# Patient Record
Sex: Female | Born: 2005 | Race: Black or African American | Hispanic: No | Marital: Single
Health system: Southern US, Community
[De-identification: ages and names within clinical notes are randomized; demographics above are authoritative.]

---
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
## 2023-03-06 ENCOUNTER — Emergency Department (HOSPITAL_COMMUNITY)
Admission: EM | Admit: 2023-03-06 | Discharge: 2023-03-06 | Disposition: A | Discharge status: Home / Self Care | Payer: Medicaid Other | Attending: Emergency Medicine | Admitting: Emergency Medicine

## 2023-03-06 ENCOUNTER — Emergency Department (HOSPITAL_COMMUNITY): Payer: Medicaid Other

## 2023-03-06 ENCOUNTER — Other Ambulatory Visit: Payer: Self-pay

## 2023-03-06 DIAGNOSIS — R55 Syncope and collapse: Secondary | ICD-10-CM | POA: Diagnosis present

## 2023-03-06 DIAGNOSIS — R519 Headache, unspecified: Secondary | ICD-10-CM | POA: Diagnosis not present

## 2023-03-06 DIAGNOSIS — R4020 Unspecified coma: Secondary | ICD-10-CM

## 2023-03-06 LAB — CBC WITH DIFFERENTIAL/PLATELET
Abs Immature Granulocytes: 0.02 10*3/uL (ref 0.00–0.07)
Basophils Absolute: 0.1 10*3/uL (ref 0.0–0.1)
Basophils Relative: 1 %
Eosinophils Absolute: 0.1 10*3/uL (ref 0.0–0.5)
Eosinophils Relative: 2 %
HCT: 35 % — ABNORMAL LOW (ref 36.0–46.0)
Hemoglobin: 12.1 g/dL (ref 12.0–15.0)
Immature Granulocytes: 0 %
Lymphocytes Relative: 27 %
Lymphs Abs: 2.5 10*3/uL (ref 0.7–4.0)
MCH: 30.9 pg (ref 26.0–34.0)
MCHC: 34.6 g/dL (ref 30.0–36.0)
MCV: 89.3 fL (ref 80.0–100.0)
Monocytes Absolute: 0.4 10*3/uL (ref 0.1–1.0)
Monocytes Relative: 5 %
Neutro Abs: 6.1 10*3/uL (ref 1.7–7.7)
Neutrophils Relative %: 65 %
Platelets: 198 10*3/uL (ref 150–400)
RBC: 3.92 MIL/uL (ref 3.87–5.11)
RDW: 12 % (ref 11.5–15.5)
WBC: 9.2 10*3/uL (ref 4.0–10.5)
nRBC: 0 % (ref 0.0–0.2)

## 2023-03-06 LAB — URINALYSIS, ROUTINE W REFLEX MICROSCOPIC
Bilirubin Urine: NEGATIVE
Glucose, UA: NEGATIVE mg/dL
Hgb urine dipstick: NEGATIVE
Ketones, ur: NEGATIVE mg/dL
Leukocytes,Ua: NEGATIVE
Nitrite: NEGATIVE
Protein, ur: NEGATIVE mg/dL
Specific Gravity, Urine: 1.009 (ref 1.005–1.030)
pH: 8 (ref 5.0–8.0)

## 2023-03-06 LAB — RAPID URINE DRUG SCREEN, HOSP PERFORMED
Amphetamines: NOT DETECTED
Barbiturates: NOT DETECTED
Benzodiazepines: NOT DETECTED
Cocaine: NOT DETECTED
Opiates: NOT DETECTED
Tetrahydrocannabinol: POSITIVE — AB

## 2023-03-06 LAB — COMPREHENSIVE METABOLIC PANEL
ALT: 21 U/L (ref 0–44)
AST: 20 U/L (ref 15–41)
Albumin: 3.9 g/dL (ref 3.5–5.0)
Alkaline Phosphatase: 47 U/L (ref 38–126)
Anion gap: 6 (ref 5–15)
BUN: 8 mg/dL (ref 6–20)
CO2: 24 mmol/L (ref 22–32)
Calcium: 8.7 mg/dL — ABNORMAL LOW (ref 8.9–10.3)
Chloride: 108 mmol/L (ref 98–111)
Creatinine, Ser: 0.71 mg/dL (ref 0.44–1.00)
GFR, Estimated: >60 mL/min (ref >60)
Glucose, Bld: 98 mg/dL (ref 70–99)
Potassium: 3.5 mmol/L (ref 3.5–5.1)
Sodium: 138 mmol/L (ref 135–145)
Total Bilirubin: 0.4 mg/dL (ref 0.3–1.2)
Total Protein: 7.2 g/dL (ref 6.5–8.1)

## 2023-03-06 LAB — MAGNESIUM: Magnesium: 1.9 mg/dL (ref 1.7–2.4)

## 2023-03-06 LAB — PREGNANCY, URINE: Preg Test, Ur: NEGATIVE

## 2023-03-06 LAB — AMMONIA: Ammonia: 33 umol/L (ref 9–35)

## 2023-03-06 MED ORDER — KETOROLAC TROMETHAMINE 15 MG/ML IJ SOLN
15.0000 mg | Freq: Once | INTRAMUSCULAR | Status: AC
  Administered 2023-03-06: 15 mg via INTRAVENOUS
  Filled 2023-03-06: qty 1

## 2023-03-06 MED ORDER — DIPHENHYDRAMINE HCL 50 MG/ML IJ SOLN
12.5000 mg | Freq: Once | INTRAMUSCULAR | Status: AC
  Administered 2023-03-06: 12.5 mg via INTRAVENOUS
  Filled 2023-03-06: qty 1

## 2023-03-06 MED ORDER — LACTATED RINGERS IV BOLUS
1000.0000 mL | Freq: Once | INTRAVENOUS | Status: AC
  Administered 2023-03-06: 1000 mL via INTRAVENOUS

## 2023-03-06 MED ORDER — METOCLOPRAMIDE HCL 5 MG/ML IJ SOLN
10.0000 mg | Freq: Once | INTRAMUSCULAR | Status: AC
  Administered 2023-03-06: 10 mg via INTRAVENOUS
  Filled 2023-03-06: qty 2

## 2023-03-06 NOTE — ED Triage Notes (Signed)
Pt BIBA from work. Pt found unresponsive on floor of [employer]. GCS of 6, only withdrawing from pain.   20g LAC  Pt responsive 10 min after arrival to ED. Aox4.

## 2023-03-06 NOTE — ED Provider Notes (Addendum)
[place] EMERGENCY DEPARTMENT AT [facility] Provider Note   CSN: [id number] Arrival date & time: 03/06/23  1854     History  Chief Complaint  Patient presents with   Loss of Consciousness    Sherri Day is a 17 y.o. female.  HPI Patient presents following syncopal episode.  This occurred while at work at [employer].  She was found facedown on the floor.  EMS noted GCS of 6 prior to arrival.  Vital signs were normal.  Medical history includes similar episodes.  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  Per chart review, she was seen at PCPs office 1 month ago.  Documentation from that time noted that over the past 6 months, they have been occurring every 1 to 2 weeks.  She is typically unconscious for several minutes.  She will have occasionally twitching episodes without convulsions.  She has had recent headaches.  She was previously seen by neurology and prescribed an unknown medication.  She has stopped taking this medication.  Sister reports that she has had increasing frequency of these episodes.  She has had 3 episodes this week.  Estimated duration of LOC today was 20 minutes.  Patient currently endorses generalized headache.  Patient reports that she has been having daily headaches over the past 6 months.    Home Medications Prior to Admission medications   Medication Sig Start Date End Date Taking? Authorizing Provider  ibuprofen (ADVIL) 100 MG/5ML suspension Take 20 mLs (400 mg total) by mouth every 6 (six) hours as needed. 08/07/20   Lorin Picket, NP  ibuprofen (ADVIL) 400 MG tablet Take 1 tablet (400 mg total) by mouth every 6 (six) hours as needed. 08/07/20   Lorin Picket, NP      Allergies    Patient has no known allergies.    Review of Systems   Review of Systems  Constitutional:  Positive for fatigue.  Neurological:  Positive for syncope and headaches.  All other systems reviewed and are negative.   Physical Exam Updated Vital  Signs BP 118/81 (BP Location: Left Arm)   Pulse 84   Temp 98.7 F (37.1 C) (Oral)   Resp 14   Ht 5\' 2"  (1.575 m)   Wt 52.2 kg   LMP  (LMP Unknown)   SpO2 100%   BMI 21.03 kg/m  Physical Exam Vitals and nursing note reviewed.  Constitutional:      General: She is not in acute distress.    Appearance: Normal appearance. She is well-developed. She is not ill-appearing, toxic-appearing or diaphoretic.  HENT:     Head: Normocephalic and atraumatic.     Right Ear: External ear normal.     Left Ear: External ear normal.     Nose: Nose normal.     Mouth/Throat:     Mouth: Mucous membranes are moist.  Eyes:     Extraocular Movements: Extraocular movements intact.     Conjunctiva/sclera: Conjunctivae normal.  Cardiovascular:     Rate and Rhythm: Normal rate and regular rhythm.     Heart sounds: No murmur heard. Pulmonary:     Effort: Pulmonary effort is normal. No respiratory distress.     Breath sounds: Normal breath sounds. No wheezing or rales.  Abdominal:     General: There is no distension.     Palpations: Abdomen is soft.     Tenderness: There is no abdominal tenderness.  Musculoskeletal:        General: No swelling or  deformity. Normal range of motion.     Cervical back: Normal range of motion and neck supple.     Right lower leg: No edema.     Left lower leg: No edema.  Skin:    General: Skin is warm and dry.     Coloration: Skin is not jaundiced or pale.  Neurological:     General: No focal deficit present.     Mental Status: She is alert and oriented to person, place, and time.     Cranial Nerves: No cranial nerve deficit.     Sensory: No sensory deficit.     Motor: No weakness.     Coordination: Coordination normal.  Psychiatric:        Mood and Affect: Mood normal. Affect is flat.        Speech: Speech is delayed.        Behavior: Behavior is slowed and withdrawn. Behavior is cooperative.     ED Results / Procedures / Treatments   Labs (all labs ordered  are listed, but only abnormal results are displayed) Labs Reviewed  COMPREHENSIVE METABOLIC PANEL - Abnormal; Notable for the following components:      Result Value   Calcium 8.7 (*)    All other components within normal limits  CBC WITH DIFFERENTIAL/PLATELET - Abnormal; Notable for the following components:   HCT 35.0 (*)    All other components within normal limits  URINALYSIS, ROUTINE W REFLEX MICROSCOPIC - Abnormal; Notable for the following components:   Color, Urine STRAW (*)    All other components within normal limits  RAPID URINE DRUG SCREEN, HOSP PERFORMED - Abnormal; Notable for the following components:   Tetrahydrocannabinol POSITIVE (*)    All other components within normal limits  AMMONIA  PREGNANCY, URINE  MAGNESIUM  CBG MONITORING, ED    EKG None  Radiology CT HEAD WO CONTRAST  Result Date: 03/06/2023 CLINICAL DATA:  Altered mental status. EXAM: CT HEAD WITHOUT CONTRAST TECHNIQUE: Contiguous axial images were obtained from the base of the skull through the vertex without intravenous contrast. RADIATION DOSE REDUCTION: This exam was performed according to the departmental dose-optimization program which includes automated exposure control, adjustment of the mA and/or kV according to patient size and/or use of iterative reconstruction technique. COMPARISON:  None Available. FINDINGS: Brain: No evidence of intracranial hemorrhage, acute infarction, hydrocephalus, extra-axial collection, or mass lesion/mass effect. Vascular:  No hyperdense vessel or other acute findings. Skull: No evidence of fracture or other significant bone abnormality. Sinuses/Orbits:  No acute findings. Other: None. IMPRESSION: Negative noncontrast head CT. Electronically Signed   By: Danae Orleans M.D.   On: 03/06/2023 20:35    Procedures Procedures    Medications Ordered in ED Medications  metoCLOPramide (REGLAN) injection 10 mg (10 mg Intravenous Given 03/06/23 2011)  diphenhydrAMINE (BENADRYL)  injection 12.5 mg (12.5 mg Intravenous Given 03/06/23 2011)  lactated ringers bolus 1,000 mL (1,000 mLs Intravenous New Bag/Given 03/06/23 2016)  ketorolac (TORADOL) 15 MG/ML injection 15 mg (15 mg Intravenous Given 03/06/23 2011)    ED Course/ Medical Decision Making/ A&P                             Medical Decision Making Amount and/or Complexity of Data Reviewed Labs: ordered. Radiology: ordered.  Risk Prescription drug management.   This patient presents to the ED for concern of LOC, this involves an extensive number of treatment options, and is a complaint that  carries with it a high risk of complications and morbidity.  The differential diagnosis includes syncope, seizure, hypoglycemia, other metabolic derangement, complex migraine, psychogenic etiology   Co morbidities that complicate the patient evaluation  N/A   Additional history obtained:  Additional history obtained from EMS, patient's sister External records from outside source obtained and reviewed including EMR   Lab Tests:  I Ordered, and personally interpreted labs.  The pertinent results include: Normal kidney function, normal electrolytes, normal hemoglobin, no leukocytosis, no UTI, UDS positive for THC   Imaging Studies ordered:  I ordered imaging studies including CT head I independently visualized and interpreted imaging which showed no acute findings I agree with the radiologist interpretation   Cardiac Monitoring: / EKG:  The patient was maintained on a cardiac monitor.  I personally viewed and interpreted the cardiac monitored which showed an underlying rhythm of: Sinus rhythm   Problem List / ED Course / Critical interventions / Medication management  Patient presents for LOC.  This occurred while at work at [employer].  She was found by coworkers facedown on the floor.  EMS noted unresponsiveness on scene and during transit.  On initial arrival in the ED, patient is minimally responsive.   Breathing is unlabored.  Vital signs are normal.  Shortly after arrival, patient awoke.  She was alert and oriented.  She had no focal neurologic deficits.  She endorsed generalized headache.  She was noted to have slowed movements and speech.  She has global weakness on exam.  Diagnostic workup was initiated in addition to treatment for headache.  Patient's lab work and CT scan were unremarkable.  On reassessment, she reports resolution of headache.  She currently is in the process of establishing care with neurology.  When questioned about the episode today, she does admit that she was aware of her surroundings during this episode.  She remembers going into the office at work to sit down because she felt unwell.  She remembers laying on the floor at work, the ambulance ride and arrival in the ED.  Patient's episode may have been a psychogenic etiology. She states that she has had recent increased psychosocial stressors.  She is currently a senior in high school.  Patient and sister do feel comfortable with discharge home at this time.  She was advised to return for any further symptoms of concern. I ordered medication including IV fluids, Reglan, Toradol, Benadryl for headache Reevaluation of the patient after these medicines showed that the patient resolved I have reviewed the patients home medicines and have made adjustments as needed   Social Determinants of Health:  Has access to outpatient care         Final Clinical Impression(s) / ED Diagnoses Final diagnoses:  Loss of consciousness    Rx / DC Orders ED Discharge Orders     None         Gloris Manchester, MD 03/06/23 2228    Gloris Manchester, MD 03/06/23 2228

## 2023-03-06 NOTE — Discharge Instructions (Signed)
Follow-up with neurology.  Stay hydrated.  Avoid stressors if possible.  Return to the emergency department at anytime for any new or worsening symptoms of concern.
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
## 2024-12-27 ENCOUNTER — Emergency Department (HOSPITAL_COMMUNITY)
Admission: EM | Admit: 2024-12-27 | Discharge: 2024-12-27 | Disposition: A | Discharge status: Home / Self Care | Source: Home / Self Care | Attending: Emergency Medicine | Admitting: Emergency Medicine

## 2024-12-27 ENCOUNTER — Other Ambulatory Visit (HOSPITAL_COMMUNITY): Payer: Self-pay

## 2024-12-27 DIAGNOSIS — J029 Acute pharyngitis, unspecified: Secondary | ICD-10-CM

## 2024-12-27 DIAGNOSIS — R569 Unspecified convulsions: Secondary | ICD-10-CM

## 2024-12-27 DIAGNOSIS — E860 Dehydration: Secondary | ICD-10-CM

## 2024-12-27 LAB — CBG MONITORING, ED: Glucose-Capillary: 83 mg/dL (ref 70–99)

## 2024-12-27 LAB — COMPREHENSIVE METABOLIC PANEL WITH GFR
ALT: 8 U/L (ref 0–44)
AST: 21 U/L (ref 15–41)
Albumin: 4.2 g/dL (ref 3.5–5.0)
Alkaline Phosphatase: 71 U/L (ref 38–126)
Anion gap: 14 (ref 5–15)
BUN: 8 mg/dL (ref 6–20)
CO2: 21 mmol/L — ABNORMAL LOW (ref 22–32)
Calcium: 8.9 mg/dL (ref 8.9–10.3)
Chloride: 107 mmol/L (ref 98–111)
Creatinine, Ser: 0.7 mg/dL (ref 0.44–1.00)
GFR, Estimated: >60 mL/min
Glucose, Bld: 75 mg/dL (ref 70–99)
Potassium: 3.5 mmol/L (ref 3.5–5.1)
Sodium: 141 mmol/L (ref 135–145)
Total Bilirubin: 0.7 mg/dL (ref 0.0–1.2)
Total Protein: 7.1 g/dL (ref 6.5–8.1)

## 2024-12-27 LAB — CBC WITH DIFFERENTIAL/PLATELET
Abs Immature Granulocytes: 0.03 10*3/uL (ref 0.00–0.07)
Basophils Absolute: 0.1 10*3/uL (ref 0.0–0.1)
Basophils Relative: 1 %
Eosinophils Absolute: 0.2 10*3/uL (ref 0.0–0.5)
Eosinophils Relative: 1 %
HCT: 38 % (ref 36.0–46.0)
Hemoglobin: 13.2 g/dL (ref 12.0–15.0)
Immature Granulocytes: 0 %
Lymphocytes Relative: 14 %
Lymphs Abs: 1.7 10*3/uL (ref 0.7–4.0)
MCH: 30.7 pg (ref 26.0–34.0)
MCHC: 34.7 g/dL (ref 30.0–36.0)
MCV: 88.4 fL (ref 80.0–100.0)
Monocytes Absolute: 1 10*3/uL (ref 0.1–1.0)
Monocytes Relative: 8 %
Neutro Abs: 9.3 10*3/uL — ABNORMAL HIGH (ref 1.7–7.7)
Neutrophils Relative %: 76 %
Platelets: 206 10*3/uL (ref 150–400)
RBC: 4.3 MIL/uL (ref 3.87–5.11)
RDW: 12 % (ref 11.5–15.5)
WBC: 12.3 10*3/uL — ABNORMAL HIGH (ref 4.0–10.5)
nRBC: 0 % (ref 0.0–0.2)

## 2024-12-27 LAB — MONONUCLEOSIS SCREEN: Mono Screen: NEGATIVE

## 2024-12-27 LAB — URINALYSIS, ROUTINE W REFLEX MICROSCOPIC
Bilirubin Urine: NEGATIVE
Glucose, UA: NEGATIVE mg/dL
Hgb urine dipstick: NEGATIVE
Ketones, ur: 80 mg/dL — AB
Leukocytes,Ua: NEGATIVE
Nitrite: NEGATIVE
Protein, ur: NEGATIVE mg/dL
Specific Gravity, Urine: 1.019 (ref 1.005–1.030)
pH: 5 (ref 5.0–8.0)

## 2024-12-27 LAB — RESP PANEL BY RT-PCR (RSV, FLU A&B, COVID)  RVPGX2
Influenza A by PCR: NEGATIVE
Influenza B by PCR: NEGATIVE
Resp Syncytial Virus by PCR: NEGATIVE
SARS Coronavirus 2 by RT PCR: NEGATIVE

## 2024-12-27 LAB — GROUP A STREP BY PCR: Group A Strep by PCR: NOT DETECTED

## 2024-12-27 LAB — HCG, SERUM, QUALITATIVE: Preg, Serum: NEGATIVE

## 2024-12-27 MED ORDER — LIDOCAINE VISCOUS HCL 2 % MT SOLN
15.0000 mL | OROMUCOSAL | 0 refills | Status: DC | PRN
  Filled 2024-12-27: qty 100, 5d supply, fill #0

## 2024-12-27 MED ORDER — ACETAMINOPHEN 500 MG PO TABS
500.0000 mg | ORAL_TABLET | Freq: Four times a day (QID) | ORAL | 0 refills | Status: DC | PRN
  Filled 2024-12-27: qty 30, 8d supply, fill #0

## 2024-12-27 MED ORDER — ACETAMINOPHEN 500 MG PO TABS
1000.0000 mg | ORAL_TABLET | Freq: Once | ORAL | Status: AC
  Administered 2024-12-27: 1000 mg via ORAL
  Filled 2024-12-27: qty 2

## 2024-12-27 MED ORDER — SODIUM CHLORIDE 0.9 % IV BOLUS
1000.0000 mL | Freq: Once | INTRAVENOUS | Status: AC
  Administered 2024-12-27: 1000 mL via INTRAVENOUS

## 2024-12-27 MED ORDER — LIDOCAINE VISCOUS HCL 2 % MT SOLN
15.0000 mL | Freq: Once | OROMUCOSAL | Status: AC
  Administered 2024-12-27: 15 mL via OROMUCOSAL
  Filled 2024-12-27: qty 15

## 2024-12-27 MED ORDER — DOXYCYCLINE HYCLATE 100 MG PO CAPS: 100.0000 mg | ORAL_CAPSULE | Freq: Two times a day (BID) | ORAL | 0 refills | Status: AC

## 2024-12-27 MED ORDER — LIDOCAINE VISCOUS HCL 2 % MT SOLN: 15.0000 mL | OROMUCOSAL | 0 refills | Status: AC | PRN

## 2024-12-27 MED ORDER — DOXYCYCLINE HYCLATE 100 MG PO CAPS
100.0000 mg | ORAL_CAPSULE | Freq: Two times a day (BID) | ORAL | 0 refills | Status: DC
  Filled 2024-12-27: qty 20, 10d supply, fill #0

## 2024-12-27 MED ORDER — ACETAMINOPHEN 500 MG PO TABS: 500.0000 mg | ORAL_TABLET | Freq: Four times a day (QID) | ORAL | 0 refills | Status: AC | PRN

## 2024-12-27 NOTE — ED Triage Notes (Addendum)
 EMS reports seizure x 5 minutes this morning an hour ago, pt back to baseline, pt also had a seizures yesterday, pt reports was taken off her seizure med by her provider in October appx .  Pt with sore throat x 2 days

## 2024-12-27 NOTE — Discharge Instructions (Signed)
 You have been evaluated for your symptoms. You have been diagnosed with pharyngitis which is inflammation/infection of your throat.  Please take medication prescribed.  Follow-up with your doctor for further care.

## 2024-12-27 NOTE — ED Provider Notes (Cosign Needed Addendum)
 " [place] EMERGENCY DEPARTMENT AT [facility] Provider Note   CSN: [id number] Arrival date & time: 12/27/24  1148     Patient presents with: Seizures and Sore Throat   Sherri Day is a 19 y.o. female.   The history is provided by the patient, medical records and the EMS personnel. No language interpreter was used.  Seizures Sore Throat     19 year old female with history of seizures brought here via EMS with complaint of sore throat.  Patient states for the past 2 days she has had progressive worsening sore throat as well as having headache.  States that she had a seizure episode last night and another 1 today.  States she was around a friend when this happened.  She denies tongue biting on urinary or bowel incontinence.  She does not endorse fever but does endorse some chills.  No chest pain or shortness of breath no abdominal pain no nausea or vomiting or diarrhea.  She mention she was diagnosed with seizures in the past and was on antiseizure medication but her doctor took it off several months ago.  She has not had any seizures up until last night.  Patient denies alcohol or drug abuse.  Her last menstruation was a week ago.  Denies any recent sick contact.  Patient mention she had similar seizure episodes when she had tonsillitis in the past.  Endorsed nausea earlier but did receive Zofran by EMS prior to arrival and now she feels better.  Prior to Admission medications  Medication Sig Start Date End Date Taking? Authorizing Provider  ibuprofen  (ADVIL ) 100 MG/5ML suspension Take 20 mLs (400 mg total) by mouth every 6 (six) hours as needed. 08/07/20   Carmelia Erma SAUNDERS, NP  ibuprofen  (ADVIL ) 400 MG tablet Take 1 tablet (400 mg total) by mouth every 6 (six) hours as needed. 08/07/20   Carmelia Erma SAUNDERS, NP    Allergies: Tomato and Lactose    Review of Systems  Neurological:  Positive for seizures.  All other systems reviewed and are negative.   Updated Vital Signs BP  (!) 132/96   Pulse 73   Temp 99.3 F (37.4 C) (Oral)   Resp 17   SpO2 100%   Physical Exam Vitals and nursing note reviewed.  Constitutional:      General: She is not in acute distress.    Appearance: She is well-developed.  HENT:     Head: Atraumatic.     Mouth/Throat:     Comments: Uvula midline bilateral tonsil enlargement with exudates.  No stridor or trismus.  No tongue injury Eyes:     Conjunctiva/sclera: Conjunctivae normal.  Cardiovascular:     Rate and Rhythm: Normal rate and regular rhythm.     Pulses: Normal pulses.     Heart sounds: Normal heart sounds.  Pulmonary:     Effort: Pulmonary effort is normal.     Breath sounds: No wheezing, rhonchi or rales.  Abdominal:     Palpations: Abdomen is soft.     Tenderness: There is no abdominal tenderness.  Musculoskeletal:        General: Normal range of motion.     Cervical back: Neck supple.  Skin:    Findings: No rash.  Neurological:     Mental Status: She is alert and oriented to person, place, and time.     GCS: GCS eye subscore is 4. GCS verbal subscore is 5. GCS motor subscore is 6.     Cranial Nerves:  No cranial nerve deficit or facial asymmetry.     Sensory: Sensation is intact.     Motor: Motor function is intact.  Psychiatric:        Mood and Affect: Mood normal.     (all labs ordered are listed, but only abnormal results are displayed) Labs Reviewed  COMPREHENSIVE METABOLIC PANEL WITH GFR - Abnormal; Notable for the following components:      Result Value   CO2 21 (*)    All other components within normal limits  CBC WITH DIFFERENTIAL/PLATELET - Abnormal; Notable for the following components:   WBC 12.3 (*)    Neutro Abs 9.3 (*)    All other components within normal limits  URINALYSIS, ROUTINE W REFLEX MICROSCOPIC - Abnormal; Notable for the following components:   APPearance HAZY (*)    Ketones, ur 80 (*)    All other components within normal limits  GROUP A STREP BY PCR  RESP PANEL BY RT-PCR  (RSV, FLU A&B, COVID)  RVPGX2  HCG, SERUM, QUALITATIVE  MONONUCLEOSIS SCREEN  CBG MONITORING, ED  POC URINE PREG, ED  GC/CHLAMYDIA PROBE AMP ([place]) NOT AT [facility]    EKG: None  Date: 12/27/2024  Rate: 82  Rhythm: normal sinus rhythm with sinus arrhythmia  QRS Axis: normal  Intervals: normal  ST/T Wave abnormalities: normal  Conduction Disutrbances: none  Narrative Interpretation:   Old EKG Reviewed: No significant changes noted    Radiology: No results found.   Procedures   Medications Ordered in the ED  acetaminophen  (TYLENOL ) tablet 1,000 mg (1,000 mg Oral Given 12/27/24 1231)  lidocaine  (XYLOCAINE ) 2 % viscous mouth solution 15 mL (15 mLs Mouth/Throat Given 12/27/24 1233)  sodium chloride  0.9 % bolus 1,000 mL (1,000 mLs Intravenous New Bag/Given 12/27/24 1503)                                    Medical Decision Making Amount and/or Complexity of Data Reviewed Labs: ordered.  Risk OTC drugs. Prescription drug management.   BP (!) 141/96 (BP Location: Right Arm)   Pulse 95   Temp 99.3 F (37.4 C) (Oral)   Resp (!) 29   SpO2 100%   23:61 PM  19 year old female with history of seizures brought here via EMS with complaint of sore throat.  Patient states for the past 2 days she has had progressive worsening sore throat as well as having headache.  States that she had a seizure episode last night and another 1 today.  States she was around a friend when this happened.  She denies tongue biting on urinary or bowel incontinence.  She does not endorse fever but does endorse some chills.  No chest pain or shortness of breath no abdominal pain no nausea or vomiting or diarrhea.  She mention she was diagnosed with seizures in the past and was on antiseizure medication but her doctor took it off several months ago.  She has not had any seizures up until last night.  Patient denies alcohol or drug abuse.  Her last menstruation was a week ago.  Denies any recent sick contact.   Patient mention she had similar seizure episodes when she had tonsillitis in the past.  Endorsed nausea earlier but did receive Zofran by EMS prior to arrival and now she feels better.  Exam notable for bilateral tonsillar lodgment with exudates.  Normal phonation.  Tolerate secretions.  No signs of  tongue injury.  Patient is mentating appropriately and strength are equal throughout.  EMR reviewed patient does have history of anxiety and depression as well as psychogenic nonepileptic seizures.  Currently patient is without any postictal state.  Suspicion for true seizure is lower.  -Labs ordered, independently viewed and interpreted by me.  Labs remarkable for negative strep, mono, RSV, flu, or COVID.  However with moderate amount of tonsillar enlargement and exudates but no evidence of PTA, and patient does admits to recent oral sexual activity with a female partner, she is at risk of potential gonorrhea or chlamydia pharyngitis.  Will prophylactically treat with doxycycline .  Throat swab ordered. -The patient was maintained on a cardiac monitor.  I personally viewed and interpreted the cardiac monitored which showed an underlying rhythm of: SR -Imaging including neck soft tissue CT considered but pt is protecting her airway, normal phonation and tolerates secretion -This patient presents to the ED for concern of sore throat, this involves an extensive number of treatment options, and is a complaint that carries with it a high risk of complications and morbidity.  The differential diagnosis includes tonsillitis, pharyngitis, strep, mono, Ch/Gh pharyngitis, viral illness, pseudo seizure, non epileptic seizure -Co morbidities that complicate the patient evaluation includes non epileptic seizure, tonsillitis,  -Treatment includes IVF, viscous lidocaine , tylenol  -Reevaluation of the patient after these medicines showed that the patient improved -PCP office notes or outside notes reviewed -Escalation to  admission/observation considered: patients feels much better, is comfortable with discharge, and will follow up with PCP -Prescription medication considered, patient comfortable with doxycycline , prednisone, viscous lidocaine  -Social Determinant of Health considered which includes food insecurity, housing insecurity      Final diagnoses:  Pharyngitis, unspecified etiology  Dehydration  Seizure-like activity [facility])    ED Discharge Orders          Ordered    doxycycline  (VIBRAMYCIN ) 100 MG capsule  2 times daily        12/27/24 1513    lidocaine  (XYLOCAINE ) 2 % solution  As needed        12/27/24 1513    acetaminophen  (TYLENOL ) 500 MG tablet  Every 6 hours PRN        12/27/24 1513               Nivia Colon, PA-C 12/27/24 1515    Nivia Colon, PA-C 12/27/24 1534  "

## 2024-12-28 LAB — GC/CHLAMYDIA PROBE AMP (~~LOC~~) NOT AT ARMC
Chlamydia: NEGATIVE
Comment: NEGATIVE
Comment: NORMAL
Neisseria Gonorrhea: NEGATIVE
